# Patient Record
Sex: Male | Born: 2000 | Race: White | Hispanic: No | Marital: Single | Smoking: Former smoker
Health system: Southern US, Community
[De-identification: ages and names within clinical notes are randomized; demographics above are authoritative.]

## PROBLEM LIST (undated history)

## (undated) DIAGNOSIS — J45909 Unspecified asthma, uncomplicated: Secondary | ICD-10-CM

## (undated) HISTORY — PX: CIRCUMCISION: SUR203

## (undated) HISTORY — PX: WISDOM TOOTH EXTRACTION: SHX21

---
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
## 2019-04-10 ENCOUNTER — Ambulatory Visit
Admission: RE | Admit: 2019-04-10 | Discharge: 2019-04-10 | Disposition: A | Discharge status: Home / Self Care | Payer: Medicaid Other | Source: Ambulatory Visit | Attending: Family | Admitting: Family

## 2019-04-10 DIAGNOSIS — R112 Nausea with vomiting, unspecified: Secondary | ICD-10-CM

## 2020-05-21 ENCOUNTER — Other Ambulatory Visit: Payer: Self-pay

## 2020-05-21 ENCOUNTER — Encounter (HOSPITAL_BASED_OUTPATIENT_CLINIC_OR_DEPARTMENT_OTHER): Payer: Self-pay | Admitting: *Deleted

## 2020-05-21 ENCOUNTER — Emergency Department (HOSPITAL_BASED_OUTPATIENT_CLINIC_OR_DEPARTMENT_OTHER)
Admission: EM | Admit: 2020-05-21 | Discharge: 2020-05-21 | Disposition: A | Discharge status: Home / Self Care | Payer: Medicaid Other | Attending: Emergency Medicine | Admitting: Emergency Medicine

## 2020-05-21 DIAGNOSIS — S61212A Laceration without foreign body of right middle finger without damage to nail, initial encounter: Secondary | ICD-10-CM | POA: Insufficient documentation

## 2020-05-21 DIAGNOSIS — Z23 Encounter for immunization: Secondary | ICD-10-CM | POA: Diagnosis not present

## 2020-05-21 DIAGNOSIS — W268XXA Contact with other sharp object(s), not elsewhere classified, initial encounter: Secondary | ICD-10-CM | POA: Insufficient documentation

## 2020-05-21 DIAGNOSIS — F17200 Nicotine dependence, unspecified, uncomplicated: Secondary | ICD-10-CM | POA: Insufficient documentation

## 2020-05-21 DIAGNOSIS — S6991XA Unspecified injury of right wrist, hand and finger(s), initial encounter: Secondary | ICD-10-CM | POA: Diagnosis present

## 2020-05-21 DIAGNOSIS — Y92810 Car as the place of occurrence of the external cause: Secondary | ICD-10-CM | POA: Insufficient documentation

## 2020-05-21 DIAGNOSIS — S61219A Laceration without foreign body of unspecified finger without damage to nail, initial encounter: Secondary | ICD-10-CM

## 2020-05-21 MED ORDER — LIDOCAINE HCL (PF) 1 % IJ SOLN
30.0000 mL | Freq: Once | INTRAMUSCULAR | Status: AC
  Administered 2020-05-21: 30 mL
  Filled 2020-05-21 (×2): qty 30

## 2020-05-21 MED ORDER — TETANUS-DIPHTH-ACELL PERTUSSIS 5-2.5-18.5 LF-MCG/0.5 IM SUSY
0.5000 mL | PREFILLED_SYRINGE | Freq: Once | INTRAMUSCULAR | Status: AC
  Administered 2020-05-21: 0.5 mL via INTRAMUSCULAR
  Filled 2020-05-21: qty 0.5

## 2020-05-21 MED ORDER — CEPHALEXIN 500 MG PO CAPS: 500.0000 mg | ORAL_CAPSULE | Freq: Two times a day (BID) | ORAL | 0 refills | Status: AC

## 2020-05-21 NOTE — Discharge Instructions (Addendum)
Please follow closely with Dr. Merry Proud as soon as possible. Take the antibiotic as prescribed. Use tylenol, motrin and an ice pack to pain relief. KEEP THE AREA CLEAN AND DRY!  WOUND CARE   Keep area clean and dry for 24 hours. Do not remove bandage, if applied.  After 24 hours, remove bandage and wash wound gently with mild soap and warm water. Reapply a new bandage after cleaning wound, if directed.  Continue daily cleansing with soap and water until stitches/staples are removed.  Do not apply any ointments or creams to the wound while stitches/staples are in place, as this may cause delayed healing.  Seek medical careif you experience any of the following signs of infection: Swelling, redness, pus drainage, streaking, fever >101.0 F  Seek care if you experience excessive bleeding that does not stop after 15-20 minutes of constant, firm pressure.

## 2020-05-21 NOTE — ED Notes (Signed)
Patient reports continued tingling in middle 3 fingers of right hand distal to injury. Patient has reduced grip in same hand.

## 2020-05-21 NOTE — ED Provider Notes (Signed)
[facility] EMERGENCY DEPARTMENT Provider Note   CSN: [id number] Arrival date & time: 05/21/20  1911     History Chief Complaint  Patient presents with  . Laceration    Tony Dalton is a 20 y.o. male who presents to the ED with laceration of  The right hand. He was working on a car and had his hand in an engine and when he pulled it up and had a gash over the dorsal mcp jt of the middle finger.Marland Kitchen He is unsure of his last tetanus. He is R hand dominant.   HPI     History reviewed. No pertinent past medical history.  There are no problems to display for this patient.   Past Surgical History:  Procedure Laterality Date  . CIRCUMCISION         No family history on file.  Social History   Tobacco Use  . Smoking status: Current Every Day Smoker  . Smokeless tobacco: Never Used  Vaping Use  . Vaping Use: Every day  Substance Use Topics  . Alcohol use: No  . Drug use: No    Home Medications Prior to Admission medications   Medication Sig Start Date End Date Taking? Authorizing Provider  albuterol (PROVENTIL HFA;VENTOLIN HFA) 108 (90 BASE) MCG/ACT inhaler Inhale 1-2 puffs into the lungs every 6 (six) hours as needed for wheezing or shortness of breath.    [provider]  ondansetron (ZOFRAN ODT) 8 MG disintegrating tablet 8mg  ODT q8 hours prn nausea 11/29/18   Palumbo, April, MD    Allergies    Patient has no known allergies.  Review of Systems   Review of Systems Ten systems reviewed and are negative for acute change, except as noted in the HPI.   Physical Exam Updated Vital Signs BP 136/75 (BP Location: Right Arm)   Pulse 60   Temp 98.1 F (36.7 C) (Oral)   Resp 19   Ht 6\' 3"  (1.905 m)   Wt 74.8 kg   SpO2 100%   BMI 20.62 kg/m   Physical Exam Vitals and nursing note reviewed.  Constitutional:      General: He is not in acute distress.    Appearance: He is well-developed and well-nourished. He is not diaphoretic.  HENT:      Head: Normocephalic and atraumatic.  Eyes:     General: No scleral icterus.    Conjunctiva/sclera: Conjunctivae normal.  Cardiovascular:     Rate and Rhythm: Normal rate and regular rhythm.     Heart sounds: Normal heart sounds.  Pulmonary:     Effort: Pulmonary effort is normal. No respiratory distress.     Breath sounds: Normal breath sounds.  Abdominal:     Palpations: Abdomen is soft.     Tenderness: There is no abdominal tenderness.  Musculoskeletal:        General: No edema.     Cervical back: Normal range of motion and neck supple.     Comments: Laceration of the dorsum of the MCP joint of the right middle finger.  There appears to be extension into the.  The finger is hanging slightly lower than the others with weak extension.  He is able to extend at all joints but strength is diminished.  No numbness or tingling.  Skin:    General: Skin is warm and dry.  Neurological:     Mental Status: He is alert.  Psychiatric:        Behavior: Behavior normal.  ED Results / Procedures / Treatments   Labs (all labs ordered are listed, but only abnormal results are displayed) Labs Reviewed - No data to display  EKG None  Radiology No results found.  Procedures .Marland KitchenLaceration Repair  Date/Time: 05/21/2020 9:47 PM Performed by: Arthor Captain, PA-C Authorized by: Arthor Captain, PA-C   Consent:    Consent obtained:  Verbal   Consent given by:  Patient   Risks discussed:  Infection, need for additional repair, pain, poor cosmetic result and poor wound healing   Alternatives discussed:  No treatment and delayed treatment Universal protocol:    Procedure explained and questions answered to patient or proxy's satisfaction: yes     Relevant documents present and verified: yes     Test results available: yes     Imaging studies available: yes     Required blood products, implants, devices, and special equipment available: yes     Site/side marked: yes     Immediately prior  to procedure, a time out was called: yes     Patient identity confirmed:  Verbally with patient Anesthesia:    Anesthesia method:  Local infiltration Laceration details:    Location:  Hand   Hand location:  R hand, dorsum   Length (cm):  2 Pre-procedure details:    Preparation:  Patient was prepped and draped in usual sterile fashion Exploration:    Wound exploration: wound explored through full range of motion and entire depth of wound visualized     Wound extent: tendon damage     Tendon damage location:  Upper extremity   Upper extremity tendon damage location:  Finger extensor   Tendon damage extent:  Partial transection   Tendon repair plan:  Refer for evaluation Treatment:    Area cleansed with:  Povidone-iodine and Shur-Clens   Amount of cleaning:  Extensive   Irrigation solution:  Sterile saline   Irrigation method:  Pressure wash Skin repair:    Repair method:  Sutures   Suture size:  4-0   Suture material:  Prolene   Suture technique:  Simple interrupted   Number of sutures:  5 Approximation:    Approximation:  Close Repair type:    Repair type:  Simple Post-procedure details:    Dressing:  Splint for protection (SPLINT IN EXTENSION)     Medications Ordered in ED Medications - No data to display  ED Course  I have reviewed the triage vital signs and the nursing notes.  Pertinent labs & imaging results that were available during my care of the patient were reviewed by me and considered in my medical decision making (see chart for details).    MDM Rules/Calculators/A&P                          Patient with laceration to the dorsum of the right hand.  He has some weakness and likely tendon disruption.  I discussed the case with Dr. Arita Miss who is on-call for hand injury tonight.  He agrees with plan to close the wound superficially and have him follow-up in the office for likely surgical repair.  The wound was cleaned extensively and patient will be placed on  Keflex.  Tetanus updated.  Placed and splint with extension of the middle finger.  He appears otherwise appropriate for discharge at this time. Final Clinical Impression(s) / ED Diagnoses Final diagnoses:  None    Rx / DC Orders ED Discharge Orders    None  Arthor Captain, PA-C 05/21/20 2207    Pollyann Savoy, MD 05/21/20 [id number]

## 2020-05-21 NOTE — ED Triage Notes (Signed)
Laceration to his right hand. Bleeding controlled. He was working on a car removing a part.

## 2020-06-13 ENCOUNTER — Other Ambulatory Visit: Payer: Self-pay | Admitting: Orthopedic Surgery

## 2020-06-13 ENCOUNTER — Other Ambulatory Visit: Payer: Self-pay

## 2020-06-13 ENCOUNTER — Encounter (HOSPITAL_BASED_OUTPATIENT_CLINIC_OR_DEPARTMENT_OTHER): Payer: Self-pay | Admitting: Orthopedic Surgery

## 2020-06-14 ENCOUNTER — Other Ambulatory Visit (HOSPITAL_COMMUNITY): Payer: Medicaid Other

## 2020-06-14 NOTE — Progress Notes (Signed)

## 2020-06-15 ENCOUNTER — Other Ambulatory Visit (HOSPITAL_COMMUNITY)
Admission: RE | Admit: 2020-06-15 | Discharge: 2020-06-15 | Disposition: A | Discharge status: Home / Self Care | Payer: Medicaid Other | Source: Ambulatory Visit | Attending: Orthopedic Surgery | Admitting: Orthopedic Surgery

## 2020-06-15 DIAGNOSIS — X58XXXA Exposure to other specified factors, initial encounter: Secondary | ICD-10-CM | POA: Diagnosis not present

## 2020-06-15 DIAGNOSIS — S66322A Laceration of extensor muscle, fascia and tendon of right middle finger at wrist and hand level, initial encounter: Secondary | ICD-10-CM | POA: Diagnosis not present

## 2020-06-15 DIAGNOSIS — Z87891 Personal history of nicotine dependence: Secondary | ICD-10-CM | POA: Diagnosis not present

## 2020-06-15 DIAGNOSIS — Z20822 Contact with and (suspected) exposure to covid-19: Secondary | ICD-10-CM | POA: Diagnosis not present

## 2020-06-15 DIAGNOSIS — S61212A Laceration without foreign body of right middle finger without damage to nail, initial encounter: Secondary | ICD-10-CM | POA: Diagnosis present

## 2020-06-15 DIAGNOSIS — Y939 Activity, unspecified: Secondary | ICD-10-CM | POA: Diagnosis not present

## 2020-06-15 DIAGNOSIS — Z833 Family history of diabetes mellitus: Secondary | ICD-10-CM | POA: Diagnosis not present

## 2020-06-15 LAB — SARS CORONAVIRUS 2 (TAT 6-24 HRS): SARS Coronavirus 2: NEGATIVE

## 2020-06-18 ENCOUNTER — Ambulatory Visit (HOSPITAL_BASED_OUTPATIENT_CLINIC_OR_DEPARTMENT_OTHER): Payer: Medicaid Other | Admitting: Certified Registered"

## 2020-06-18 ENCOUNTER — Other Ambulatory Visit: Payer: Self-pay

## 2020-06-18 ENCOUNTER — Encounter (HOSPITAL_BASED_OUTPATIENT_CLINIC_OR_DEPARTMENT_OTHER)
Admission: RE | Disposition: A | Discharge status: Home / Self Care | Payer: Self-pay | Source: Home / Self Care | Attending: Orthopedic Surgery

## 2020-06-18 ENCOUNTER — Encounter (HOSPITAL_BASED_OUTPATIENT_CLINIC_OR_DEPARTMENT_OTHER): Payer: Self-pay | Admitting: Orthopedic Surgery

## 2020-06-18 ENCOUNTER — Ambulatory Visit (HOSPITAL_BASED_OUTPATIENT_CLINIC_OR_DEPARTMENT_OTHER)
Admission: RE | Admit: 2020-06-18 | Discharge: 2020-06-18 | Disposition: A | Discharge status: Home / Self Care | Payer: Medicaid Other | Attending: Orthopedic Surgery | Admitting: Orthopedic Surgery

## 2020-06-18 DIAGNOSIS — X58XXXA Exposure to other specified factors, initial encounter: Secondary | ICD-10-CM | POA: Insufficient documentation

## 2020-06-18 DIAGNOSIS — Z833 Family history of diabetes mellitus: Secondary | ICD-10-CM | POA: Diagnosis not present

## 2020-06-18 DIAGNOSIS — Z20822 Contact with and (suspected) exposure to covid-19: Secondary | ICD-10-CM | POA: Diagnosis not present

## 2020-06-18 DIAGNOSIS — Z87891 Personal history of nicotine dependence: Secondary | ICD-10-CM | POA: Diagnosis not present

## 2020-06-18 DIAGNOSIS — S66322A Laceration of extensor muscle, fascia and tendon of right middle finger at wrist and hand level, initial encounter: Secondary | ICD-10-CM | POA: Diagnosis not present

## 2020-06-18 DIAGNOSIS — Y939 Activity, unspecified: Secondary | ICD-10-CM | POA: Insufficient documentation

## 2020-06-18 HISTORY — DX: Unspecified asthma, uncomplicated: J45.909

## 2020-06-18 HISTORY — PX: TENDON REPAIR: SHX5111

## 2020-06-18 SURGERY — TENDON REPAIR
Anesthesia: Regional | Laterality: Right

## 2020-06-18 MED ORDER — TRAMADOL HCL 50 MG PO TABS: 50.0000 mg | ORAL_TABLET | Freq: Four times a day (QID) | ORAL | 0 refills | Status: AC | PRN

## 2020-06-18 MED ORDER — OXYCODONE HCL 5 MG PO TABS: 5.0000 mg | ORAL_TABLET | Freq: Once | ORAL | Status: DC | PRN

## 2020-06-18 MED ORDER — LACTATED RINGERS IV SOLN: INTRAVENOUS | Status: DC

## 2020-06-18 MED ORDER — PROMETHAZINE HCL 25 MG/ML IJ SOLN: 6.2500 mg | INTRAMUSCULAR | Status: DC | PRN

## 2020-06-18 MED ORDER — LIDOCAINE HCL (PF) 0.5 % IJ SOLN
INTRAMUSCULAR | Status: DC | PRN
  Administered 2020-06-18: 30 mL via INTRAVENOUS

## 2020-06-18 MED ORDER — FENTANYL CITRATE (PF) 100 MCG/2ML IJ SOLN
INTRAMUSCULAR | Status: AC
  Filled 2020-06-18: qty 2

## 2020-06-18 MED ORDER — CEFAZOLIN SODIUM-DEXTROSE 2-4 GM/100ML-% IV SOLN
2.0000 g | INTRAVENOUS | Status: AC
  Administered 2020-06-18: 2 g via INTRAVENOUS

## 2020-06-18 MED ORDER — LIDOCAINE HCL (PF) 1 % IJ SOLN
INTRAMUSCULAR | Status: DC | PRN
  Administered 2020-06-18: 5 mL

## 2020-06-18 MED ORDER — FENTANYL CITRATE (PF) 100 MCG/2ML IJ SOLN
25.0000 ug | INTRAMUSCULAR | Status: DC | PRN
  Administered 2020-06-18: 50 ug via INTRAVENOUS

## 2020-06-18 MED ORDER — MIDAZOLAM HCL 2 MG/2ML IJ SOLN
INTRAMUSCULAR | Status: AC
  Filled 2020-06-18: qty 2

## 2020-06-18 MED ORDER — OXYCODONE HCL 5 MG/5ML PO SOLN: 5.0000 mg | Freq: Once | ORAL | Status: DC | PRN

## 2020-06-18 MED ORDER — BUPIVACAINE HCL (PF) 0.25 % IJ SOLN
INTRAMUSCULAR | Status: DC | PRN
  Administered 2020-06-18: 5 mL

## 2020-06-18 MED ORDER — FENTANYL CITRATE (PF) 100 MCG/2ML IJ SOLN
INTRAMUSCULAR | Status: DC | PRN
  Administered 2020-06-18 (×2): 50 ug via INTRAVENOUS

## 2020-06-18 MED ORDER — DEXMEDETOMIDINE (PRECEDEX) IN NS 20 MCG/5ML (4 MCG/ML) IV SYRINGE
PREFILLED_SYRINGE | INTRAVENOUS | Status: DC | PRN
  Administered 2020-06-18: 4 ug via INTRAVENOUS

## 2020-06-18 MED ORDER — MIDAZOLAM HCL 5 MG/5ML IJ SOLN
INTRAMUSCULAR | Status: DC | PRN
  Administered 2020-06-18: 2 mg via INTRAVENOUS

## 2020-06-18 MED ORDER — CEFAZOLIN SODIUM-DEXTROSE 2-4 GM/100ML-% IV SOLN
INTRAVENOUS | Status: AC
  Filled 2020-06-18: qty 100

## 2020-06-18 MED ORDER — ONDANSETRON HCL 4 MG/2ML IJ SOLN
INTRAMUSCULAR | Status: DC | PRN
  Administered 2020-06-18: 4 mg via INTRAVENOUS

## 2020-06-18 MED ORDER — PROPOFOL 500 MG/50ML IV EMUL
INTRAVENOUS | Status: DC | PRN
  Administered 2020-06-18: 25 ug/kg/min via INTRAVENOUS

## 2020-06-18 SURGICAL SUPPLY — 63 items
APL PRP STRL LF DISP 70% ISPRP (MISCELLANEOUS) ×1
BLADE MINI RND TIP GREEN BEAV (BLADE) IMPLANT
BLADE SURG 15 STRL LF DISP TIS (BLADE) ×1 IMPLANT
BLADE SURG 15 STRL SS (BLADE) ×2
BNDG CMPR 9X4 STRL LF SNTH (GAUZE/BANDAGES/DRESSINGS) ×1
BNDG COHESIVE 3X5 TAN STRL LF (GAUZE/BANDAGES/DRESSINGS) ×2 IMPLANT
BNDG ESMARK 4X9 LF (GAUZE/BANDAGES/DRESSINGS) ×2 IMPLANT
BNDG GAUZE ELAST 4 BULKY (GAUZE/BANDAGES/DRESSINGS) ×2 IMPLANT
CHLORAPREP W/TINT 26 (MISCELLANEOUS) ×2 IMPLANT
CORD BIPOLAR FORCEPS 12FT (ELECTRODE) ×2 IMPLANT
COVER BACK TABLE 60X90IN (DRAPES) ×2 IMPLANT
COVER MAYO STAND STRL (DRAPES) ×2 IMPLANT
COVER WAND RF STERILE (DRAPES) IMPLANT
CUFF TOURN SGL QUICK 18X4 (TOURNIQUET CUFF) IMPLANT
DRAPE EXTREMITY T 121X128X90 (DISPOSABLE) ×2 IMPLANT
DRAPE OEC MINIVIEW 54X84 (DRAPES) IMPLANT
DRAPE SURG 17X23 STRL (DRAPES) ×2 IMPLANT
GAUZE SPONGE 4X4 12PLY STRL (GAUZE/BANDAGES/DRESSINGS) ×2 IMPLANT
GAUZE XEROFORM 1X8 LF (GAUZE/BANDAGES/DRESSINGS) ×2 IMPLANT
GLOVE SURG ORTHO LTX SZ8 (GLOVE) ×2 IMPLANT
GLOVE SURG UNDER POLY LF SZ8.5 (GLOVE) ×2 IMPLANT
GOWN STRL REUS W/ TWL LRG LVL3 (GOWN DISPOSABLE) ×1 IMPLANT
GOWN STRL REUS W/TWL LRG LVL3 (GOWN DISPOSABLE) ×2
GOWN STRL REUS W/TWL XL LVL3 (GOWN DISPOSABLE) ×2 IMPLANT
K-WIRE .035X4 (WIRE) IMPLANT
LOOP VESSEL MAXI BLUE (MISCELLANEOUS) IMPLANT
NEEDLE HYPO 22GX1.5 SAFETY (NEEDLE) ×2 IMPLANT
NEEDLE KEITH (NEEDLE) IMPLANT
NEEDLE PRECISIONGLIDE 27X1.5 (NEEDLE) IMPLANT
NS IRRIG 1000ML POUR BTL (IV SOLUTION) ×2 IMPLANT
PACK BASIN DAY SURGERY FS (CUSTOM PROCEDURE TRAY) ×2 IMPLANT
PAD CAST 3X4 CTTN HI CHSV (CAST SUPPLIES) ×1 IMPLANT
PADDING CAST ABS 3INX4YD NS (CAST SUPPLIES) ×1
PADDING CAST ABS 4INX4YD NS (CAST SUPPLIES) ×1
PADDING CAST ABS COTTON 3X4 (CAST SUPPLIES) ×1 IMPLANT
PADDING CAST ABS COTTON 4X4 ST (CAST SUPPLIES) ×1 IMPLANT
PADDING CAST COTTON 3X4 STRL (CAST SUPPLIES) ×2
SLEEVE SCD COMPRESS KNEE MED (STOCKING) ×2 IMPLANT
SPLINT PLASTER CAST XFAST 3X15 (CAST SUPPLIES) ×1 IMPLANT
SPLINT PLASTER XTRA FASTSET 3X (CAST SUPPLIES) ×1
STOCKINETTE 4X48 STRL (DRAPES) ×2 IMPLANT
SUT CHROMIC 5 0 P 3 (SUTURE) IMPLANT
SUT ETHIBOND 3-0 V-5 (SUTURE) IMPLANT
SUT ETHILON 4 0 PS 2 18 (SUTURE) ×2 IMPLANT
SUT ETHILON 5 0 PC 1 (SUTURE) ×2 IMPLANT
SUT FIBERWIRE 2-0 18 17.9 3/8 (SUTURE)
SUT FIBERWIRE 4-0 18 TAPR NDL (SUTURE) ×2
SUT MERSILENE 4 0 P 3 (SUTURE) IMPLANT
SUT MERSILENE 6-0 18IN S14 8MM (SUTURE) ×2
SUT PROLENE 2 0 SH DA (SUTURE) IMPLANT
SUT SILK 4 0 PS 2 (SUTURE) IMPLANT
SUT VIC AB 3-0 PS1 18 (SUTURE)
SUT VIC AB 3-0 PS1 18XBRD (SUTURE) IMPLANT
SUT VIC AB 4-0 P-3 18XBRD (SUTURE) IMPLANT
SUT VIC AB 4-0 P3 18 (SUTURE)
SUT VICRYL 4-0 PS2 18IN ABS (SUTURE) IMPLANT
SUTURE FIBERWR 2-0 18 17.9 3/8 (SUTURE) IMPLANT
SUTURE FIBERWR 4-0 18 TAPR NDL (SUTURE) ×1 IMPLANT
SUTURE MERSLN 6-0 18IN S14 8MM (SUTURE) ×1 IMPLANT
SYR BULB EAR ULCER 3OZ GRN STR (SYRINGE) ×2 IMPLANT
SYR CONTROL 10ML LL (SYRINGE) IMPLANT
TOWEL GREEN STERILE FF (TOWEL DISPOSABLE) ×4 IMPLANT
UNDERPAD 30X36 HEAVY ABSORB (UNDERPADS AND DIAPERS) ×2 IMPLANT

## 2020-06-18 NOTE — Anesthesia Postprocedure Evaluation (Signed)
Anesthesia Post Note  Patient: Tony Dalton  Procedure(s) Performed: REPAIR EXTENSOR TENDON RIGHT MIDDLE FINGER REPAIR (Right )     Patient location during evaluation: PACU Anesthesia Type: Bier Block Level of consciousness: awake and alert Pain management: pain level controlled Vital Signs Assessment: post-procedure vital signs reviewed and stable Respiratory status: spontaneous breathing, nonlabored ventilation and respiratory function stable Cardiovascular status: stable, blood pressure returned to baseline and bradycardic Anesthetic complications: no   No complications documented.  Last Vitals:  Vitals:   06/18/20 1415 06/18/20 1430  BP: (!) 147/78   Pulse: (!) 49 (!) 44  Resp: 16 14  Temp:    SpO2: 100% 99%    Last Pain:  Vitals:   06/18/20 1430  TempSrc:   PainSc: 4                  [name]

## 2020-06-18 NOTE — Op Note (Signed)
NAME: Tony Dalton MEDICAL RECORD NO: [id number] DATE OF BIRTH: 09-01-2000 FACILITY: [facility] LOCATION: [facility] PHYSICIAN: Nicki Reaper, MD   OPERATIVE REPORT   DATE OF PROCEDURE: 06/18/20    PREOPERATIVE DIAGNOSIS:   Laceration extensor tendon right middle   POSTOPERATIVE DIAGNOSIS:   Same   PROCEDURE:   Repair extensor tendon right middle   SURGEON: Cindee Salt, M.D.   ASSISTANT: none   ANESTHESIA:  Bier block with sedation   INTRAVENOUS FLUIDS:  Per anesthesia flow sheet.   ESTIMATED BLOOD LOSS:  Minimal.   COMPLICATIONS:  None.   SPECIMENS:  none   TOURNIQUET TIME:    Total Tourniquet Time Documented: Forearm (Right) - 38 minutes Total: Forearm (Right) - 38 minutes    DISPOSITION:  Stable to PACU.   INDICATIONS: Patient is a 20 year old male sustained a laceration over the metacarpal phalangeal joint of his right middle finger he has the inability to extend at this time.  The injury occurred approximately a month ago.  He was seen elsewhere and referred for continued treatment with the inability to extend his right middle finger.  We have discussed repair of the extensor tendon.  Preperi-and postoperative course been discussed along with risk complications.  He is aware there is no guarantee to the surgery the possibility of infection recurrence injury to arteries nerves tendons complete relief symptoms and dystrophy.  In the preoperative area the patient is seen extremity marked by both patient and surgeon antibiotic given  OPERATIVE COURSE: She is brought to the operating room where a forearm-based IV regional anesthetic was carried out without difficulty under the direction the anesthesia department after being placed in a supine position with the right arm free.  Prep was done with ChloraPrep a 3-minute dry time allowed and timeout taken to confirm patient procedure.  The old incision was used and carried proximally and distally in a zigzag manner  carried down through subcutaneous tissue the laceration of the extensor tendon was immediately apparent this was complete.  The area was debrided minimally with sharp dissection using a scalpel.  Approximately 1 mm of tissue was removed from the extensor tendon and surrounding tissue.  Wound was irrigated rrigated.  The extensor tendon was repaired using a 4-0 FiberWire suture using a Kessler technique.  A second Kessler suture was then placed through the FiberWire.  The upper T9 was then repaired with a running 6-0 Mersilene suture.  Wound was again irrigated.  The skin was then closed erupted 4 nylon sutures.  A sterile compressive dressing volar splint with wrist extended and the metacarpal phalangeal joint extended with the PIP DIP free.  Inflation of the tourniquet all fingers immediately pink.  He was taken to the recovery room for observation in satisfactory condition.  He will be discharged home to return to the hand center [facility] in 1 week and Tylenol ibuprofen for pain with Ultram for breakthrough.   Cindee Salt, MD Electronically signed, 06/18/20

## 2020-06-18 NOTE — Anesthesia Preprocedure Evaluation (Addendum)
Anesthesia Evaluation  Patient identified by MRN, date of birth, ID band Patient awake    Reviewed: Allergy & Precautions, NPO status , Patient's Chart, lab work & pertinent test results  History of Anesthesia Complications Negative for: history of anesthetic complications  Airway Mallampati: II  TM Distance: >3 FB Neck ROM: Full    Dental  (+) Dental Advisory Given, Teeth Intact   Pulmonary asthma (childhood) , Current Smoker (vape) and Patient abstained from smoking.,    Pulmonary exam normal        Cardiovascular negative cardio ROS Normal cardiovascular exam     Neuro/Psych negative neurological ROS  negative psych ROS   GI/Hepatic negative GI ROS, Neg liver ROS,   Endo/Other  negative endocrine ROS  Renal/GU negative Renal ROS     Musculoskeletal negative musculoskeletal ROS (+)   Abdominal   Peds  Hematology negative hematology ROS (+)   Anesthesia Other Findings Covid test negative   Reproductive/Obstetrics                            Anesthesia Physical Anesthesia Plan  ASA: II  Anesthesia Plan: Bier Block and Bier Block-LIDOCAINE ONLY   Post-op Pain Management:    Induction: Intravenous  PONV Risk Score and Plan: 1 and Propofol infusion and Treatment may vary due to age or medical condition  Airway Management Planned: Natural Airway and Simple Face Mask  Additional Equipment: None  Intra-op Plan:   Post-operative Plan:   Informed Consent: I have reviewed the patients History and Physical, chart, labs and discussed the procedure including the risks, benefits and alternatives for the proposed anesthesia with the patient or authorized representative who has indicated his/her understanding and acceptance.       Plan Discussed with: CRNA, Anesthesiologist and Surgeon  Anesthesia Plan Comments:        Anesthesia Quick Evaluation

## 2020-06-18 NOTE — Brief Op Note (Signed)
06/18/2020  2:01 PM  PATIENT:  Tony Dalton  20 y.o. male  PRE-OPERATIVE DIAGNOSIS:  laceration of extensor tendon right hand  POST-OPERATIVE DIAGNOSIS:  laceration of extensor tendon right hand  PROCEDURE:  Procedure(s): REPAIR EXTENSOR TENDON RIGHT MIDDLE FINGER REPAIR (Right)  SURGEON:  Surgeon(s) and Role:    Cindee Salt, MD - Primary  PHYSICIAN ASSISTANT:   ASSISTANTS: none   ANESTHESIA:   regional and IV sedation  EBL:  5 mL   BLOOD ADMINISTERED:none  DRAINS: none   LOCAL MEDICATIONS USED:  NONE  SPECIMEN:  No Specimen  DISPOSITION OF SPECIMEN:  N/A  COUNTS:correct  TOURNIQUET:   Total Tourniquet Time Documented: Forearm (Right) - 38 minutes Total: Forearm (Right) - 38 minutes   DICTATION: .Reubin Milan Dictation  PLAN OF CARE: Discharge to home after PACU  PATIENT DISPOSITION:  PACU - hemodynamically stable.

## 2020-06-18 NOTE — Transfer of Care (Signed)
Immediate Anesthesia Transfer of Care Note  Patient: Tony Dalton  Procedure(s) Performed: REPAIR EXTENSOR TENDON RIGHT MIDDLE FINGER REPAIR (Right )  Patient Location: PACU  Anesthesia Type:Bier block  Level of Consciousness: awake and alert   Airway & Oxygen Therapy: Patient Spontanous Breathing and Patient connected to face mask oxygen  Post-op Assessment: Report given to RN and Post -op Vital signs reviewed and stable  Post vital signs: Reviewed and stable  Last Vitals:  Vitals Value Taken Time  BP 151/78 06/18/20 1402  Temp    Pulse 48 06/18/20 1403  Resp 13 06/18/20 1403  SpO2 100 % 06/18/20 1403  Vitals shown include unvalidated device data.  Last Pain:  Vitals:   06/18/20 1245  TempSrc: Oral  PainSc: 0-No pain      Patients Stated Pain Goal: 4 (06/18/20 1245)  Complications: No complications documented.

## 2020-06-18 NOTE — Discharge Instructions (Addendum)

## 2020-06-18 NOTE — H&P (Signed)
Tony Dalton is an 20 y.o. male.   Chief Complaint: Tony Dalton HPI: Tony Dalton is a 20 year old right-hand-dominant male sustained a laceration over the dorsal aspect of his right [name] on March 8. He was seen at med center where this was repaired. He comes in today with the inability to extend his finger. Sutures have been removed. He is unable to straighten it. He has no prior history of injury. Is not planing of any pain or discomfort at the present time. Is no history of diabetes thyroid problems arthritis or gout. Family history is positive diabetes he has not been tested.     Past Medical History:  Diagnosis Date  . Asthma     Past Surgical History:  Procedure Laterality Date  . CIRCUMCISION    . WISDOM TOOTH EXTRACTION      History reviewed. No pertinent family history. Social History:  reports that he has quit smoking. He has never used smokeless tobacco. He reports that he does not drink alcohol and does not use drugs.  Allergies: No Known Allergies  No medications prior to admission.    No results found for this or any previous visit (from the past 48 hour(s)).  No results found.   Pertinent items are noted in HPI.  Height 6\' 3"  (1.905 m), weight 75.8 kg.  General appearance: alert, cooperative and appears stated age Head: Normocephalic, without obvious abnormality Neck: no JVD Resp: clear to auscultation bilaterally Cardio: regular rate and rhythm, S1, S2 normal, no murmur, click, rub or gallop GI: soft, non-tender; bowel sounds normal; no masses,  no organomegaly Extremities: Ability to extend his [name] right hand Pulses: 2+ and symmetric Skin: Skin color, texture, turgor normal. No rashes or lesions Neurologic: Grossly normal Incision/Wound: na  Assessment/Plan Assessment:   Laceration of extensor muscle, fascia and tendon of right [name] at wrist and hand level   Plan: He is seen with his mother we have  recommended surgical repair of the extensor tendon right [name] prepare postoperative course are discussed along with risk and complications. There is aware that there is no guarantee with the surgery possibility of infection recurrence injury to arteries nerves tendons amount of time for healing. Is scheduled as an outpatient under regional anesthesia for repair extensor tendon right [name].     06/18/2020, 11:40 AM

## 2020-06-19 ENCOUNTER — Encounter (HOSPITAL_BASED_OUTPATIENT_CLINIC_OR_DEPARTMENT_OTHER): Payer: Self-pay | Admitting: Orthopedic Surgery

## 2020-07-28 IMAGING — CT CT ABD-PELV W/ CM
2 of 4 series · 16 of 46 positions shown, 18 images · IV contrast (APPLIED)
Comparison: None.

CLINICAL DATA: 18-year-old male with nausea and vomiting since
yesterday.

EXAM:
CT ABDOMEN AND PELVIS WITH CONTRAST
TECHNIQUE: Multidetector CT imaging of the abdomen and pelvis was performed
using the standard protocol following bolus administration of
intravenous contrast.
CONTRAST:  100mL OMNIPAQUE IOHEXOL 300 MG/ML  SOLN

[Series 3: axial st · axial · 0.80mm/px · z∈[+138,+598]mm · 13 of 100 slices shown, 15 images]
[im 4/100  soft-tissue]
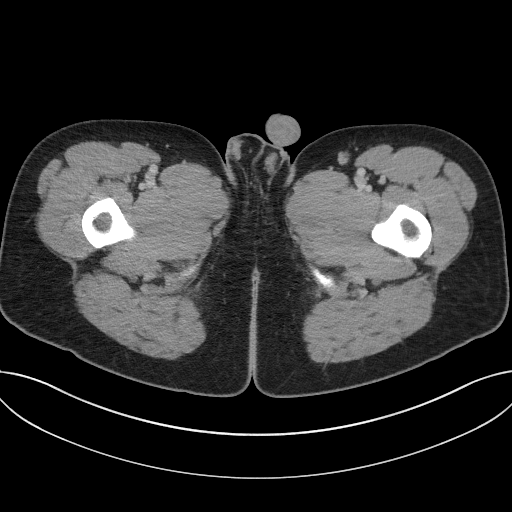
[im 4/100  bone]
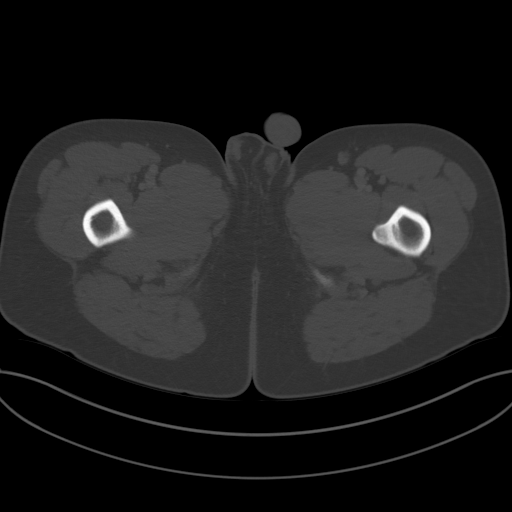
[im 12/100  soft-tissue]
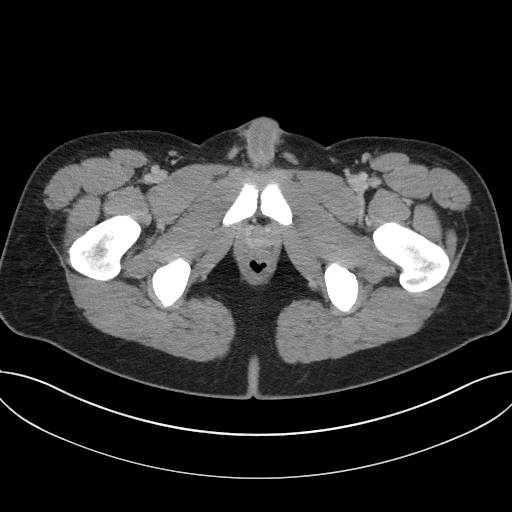
[im 20/100  soft-tissue]
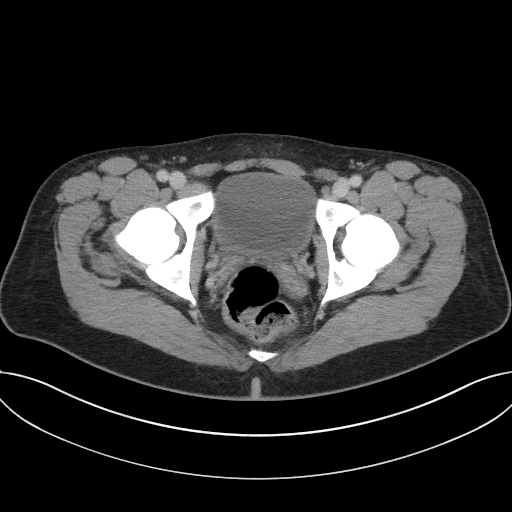
[im 28/100  soft-tissue]
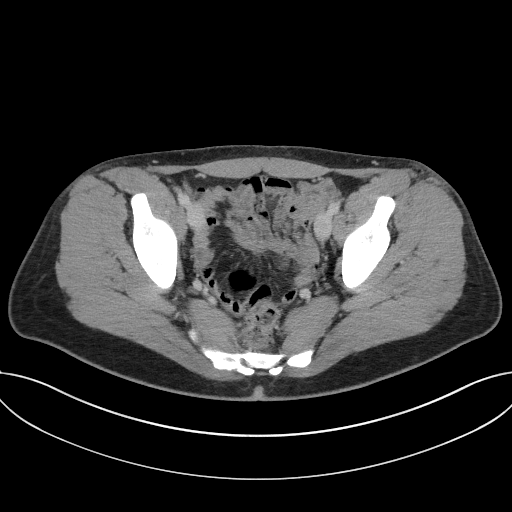
[im 36/100  soft-tissue]
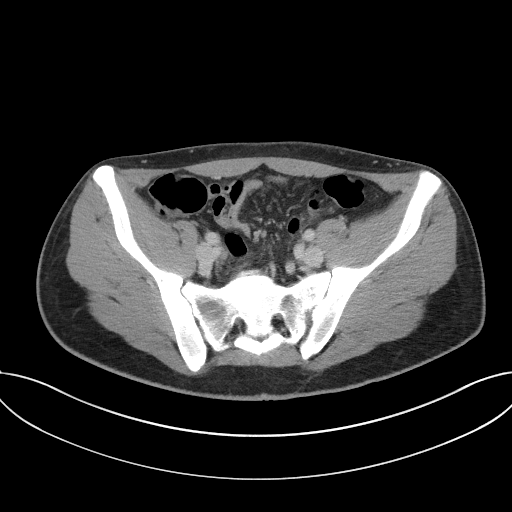
[im 44/100  soft-tissue]
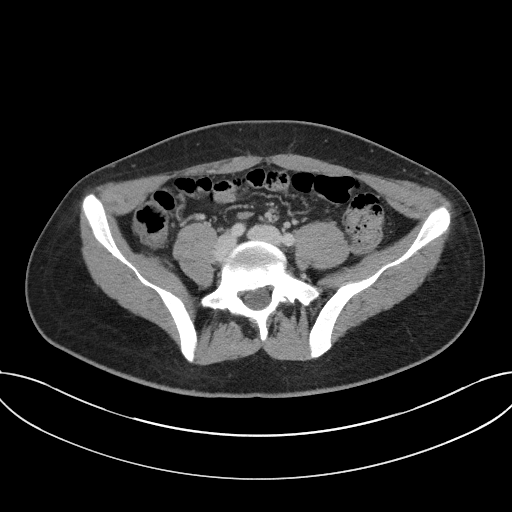
[im 52/100  soft-tissue]
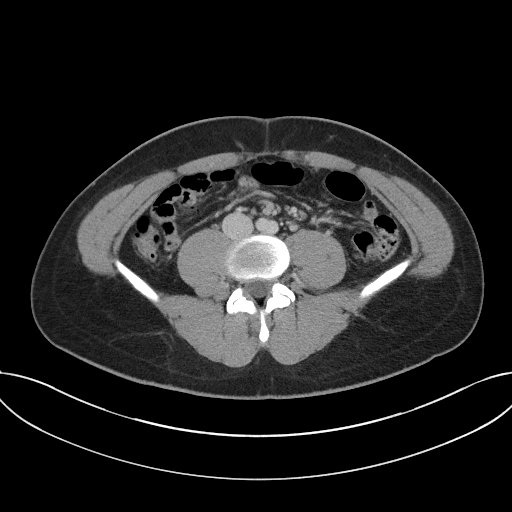
[im 56/100  soft-tissue]
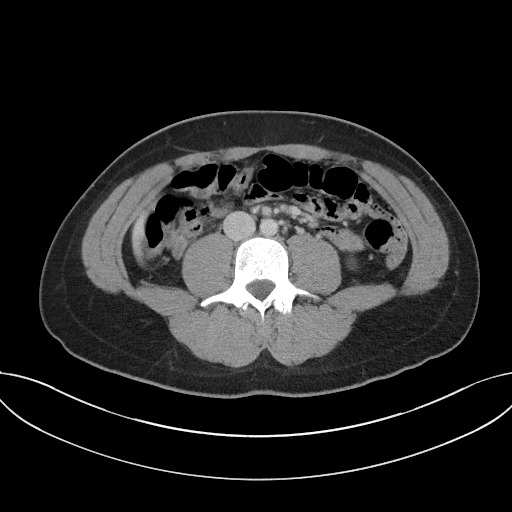
[im 64/100  soft-tissue]
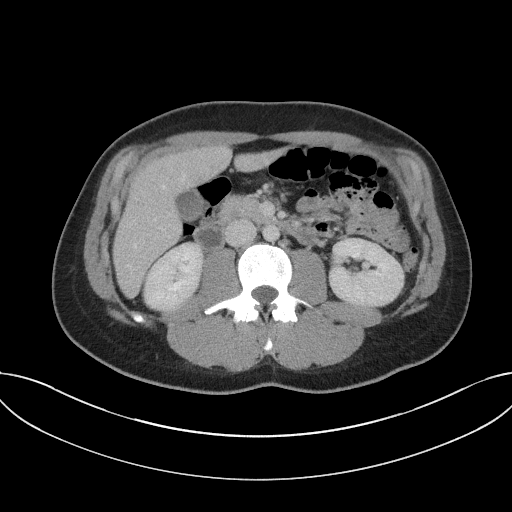
[im 64/100  bone]
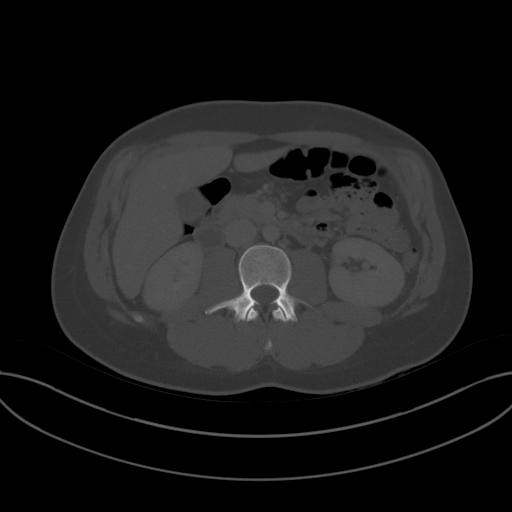
[im 72/100  soft-tissue]
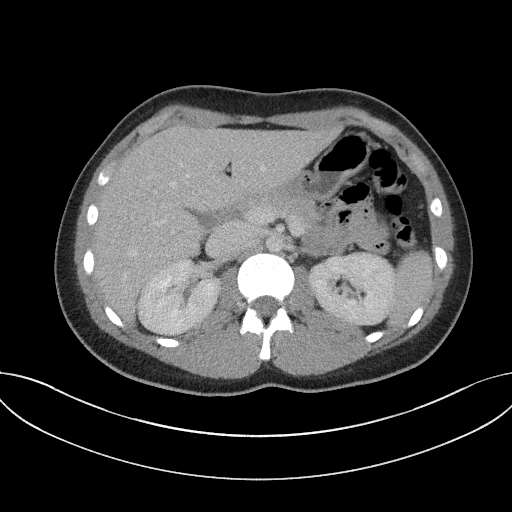
[im 80/100  soft-tissue]
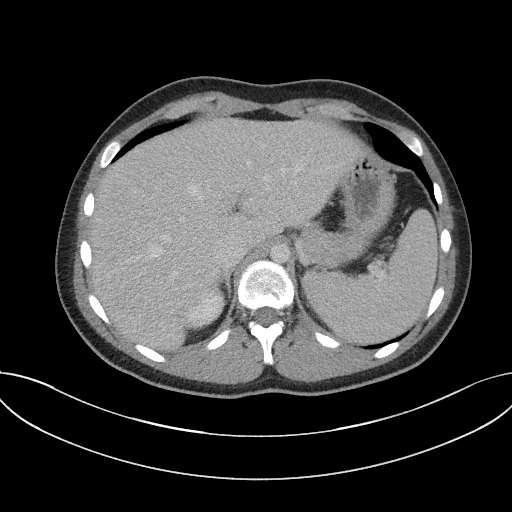
[im 88/100  soft-tissue]
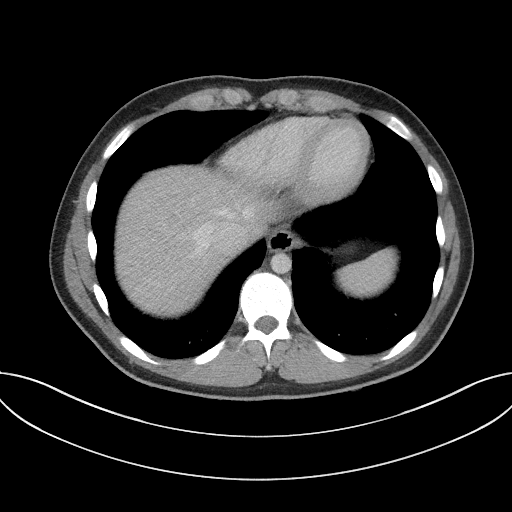
[im 96/100  soft-tissue]
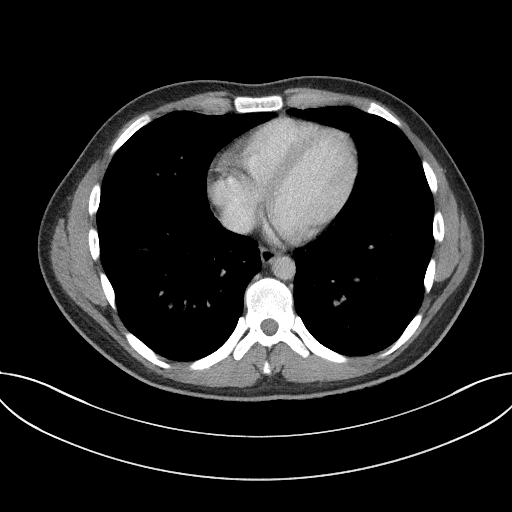

[Series 6: coronal st · coronal · 0.85mm/px · 3 of 73 slices shown]
[im 25/73  soft-tissue]
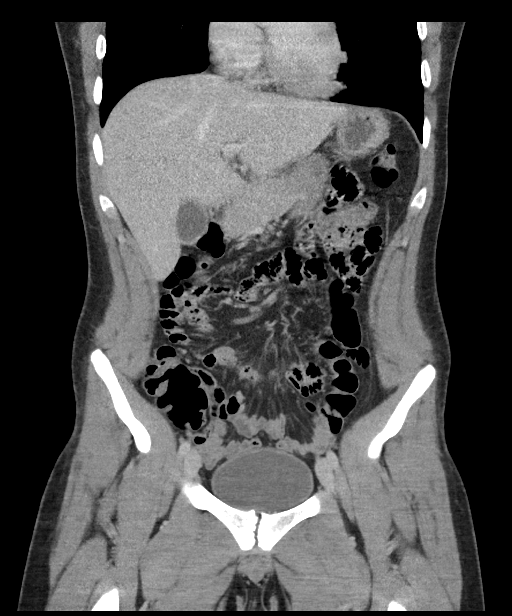
[im 33/73  soft-tissue]
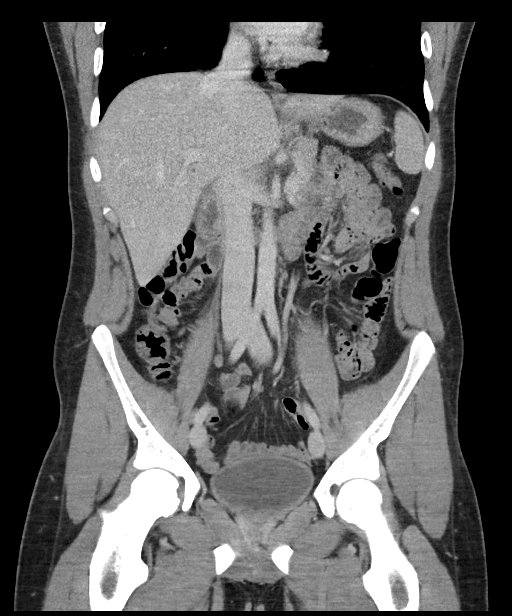
[im 41/73  soft-tissue]
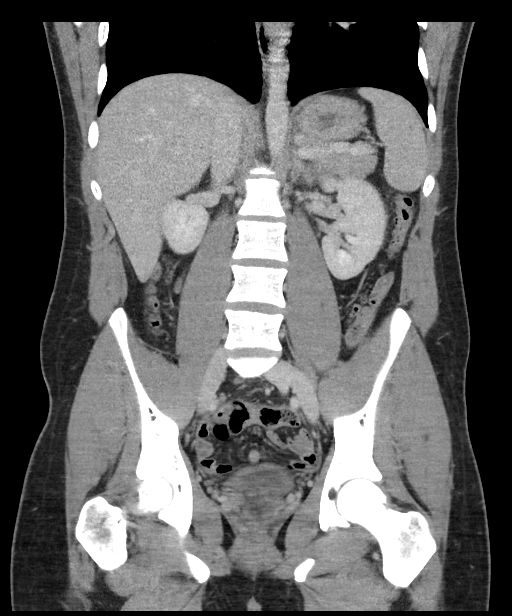

[16 of 46 positions shown; findings below may reference images not displayed]

FINDINGS: Lower chest: Mild dependent ground-glass opacity in the lower lobes
is likely atelectasis. No pericardial or pleural effusion.

Hepatobiliary: Negative liver and gallbladder.

Pancreas: Negative.

Spleen: Negative.

Adrenals/Urinary Tract: Normal adrenal glands. Symmetric renal
enhancement. No hydronephrosis or perinephric stranding. No
nephrolithiasis identified. Decompressed proximal ureters.
Unremarkable urinary bladder.

Stomach/Bowel: Negative rectum. Redundant but otherwise negative
sigmoid colon. The descending colon is redundant, otherwise
negative. Negative transverse colon. Negative right colon and
terminal ileum.

Normal appendix can be followed from the cecum toward the sacral
promontory and is visible on coronal image 43. No dilated small
bowel. No mesenteric stranding. Negative stomach and duodenum. No
free air, free fluid.

Vascular/Lymphatic: Major arterial structures and the central venous
structures in the abdomen and pelvis appear patent and normal.
Portal venous system appears patent. No lymphadenopathy.

Reproductive: Negative.

Other: No pelvic free fluid.

Musculoskeletal: No osseous abnormality identified. Nearing skeletal
maturity.
IMPRESSION: Negative CT Abdomen and Pelvis. Normal appendix, and no inflammatory
process identified.

## 2021-05-12 ENCOUNTER — Other Ambulatory Visit: Payer: Self-pay

## 2021-05-12 ENCOUNTER — Encounter: Payer: Self-pay | Admitting: Emergency Medicine

## 2021-05-12 ENCOUNTER — Ambulatory Visit
Admission: EM | Admit: 2021-05-12 | Discharge: 2021-05-12 | Disposition: A | Discharge status: Home / Self Care | Payer: Medicaid Other | Attending: Physician Assistant | Admitting: Physician Assistant

## 2021-05-12 ENCOUNTER — Ambulatory Visit: Admit: 2021-05-12 | Disposition: A | Discharge status: Home / Self Care | Payer: Self-pay

## 2021-05-12 DIAGNOSIS — Z202 Contact with and (suspected) exposure to infections with a predominantly sexual mode of transmission: Secondary | ICD-10-CM | POA: Insufficient documentation

## 2021-05-12 MED ORDER — DOXYCYCLINE HYCLATE 100 MG PO CAPS: 100.0000 mg | ORAL_CAPSULE | Freq: Two times a day (BID) | ORAL | 0 refills | Status: AC

## 2021-05-12 NOTE — Discharge Instructions (Signed)
Take antibiotic as prescribed Will call with test results  Abstain from sexual activity until treatment completed

## 2021-05-12 NOTE — ED Provider Notes (Signed)
[facility]    CSN: [id number] Arrival date & time: 05/12/21  1800      History   Chief Complaint Chief Complaint  Patient presents with   SEXUALLY TRANSMITTED DISEASE    HPI Tony Dalton is a 21 y.o. male.   Pt reports his girlfriend recently tested positive for chlamydia.  He reports at this time he is asymptomatic.  Requesting STD testing today.    Past Medical History:  Diagnosis Date   Asthma     There are no problems to display for this patient.   Past Surgical History:  Procedure Laterality Date   CIRCUMCISION     TENDON REPAIR Right 06/18/2020   Procedure: REPAIR EXTENSOR TENDON RIGHT MIDDLE FINGER REPAIR;  Surgeon: Daryll Brod, MD;  Location: [place];  Service: Orthopedics;  Laterality: Right;   WISDOM TOOTH EXTRACTION         Home Medications    Prior to Admission medications   Medication Sig Start Date End Date Taking? Authorizing Provider  doxycycline (VIBRAMYCIN) 100 MG capsule Take 1 capsule (100 mg total) by mouth 2 (two) times daily for 7 days. 05/12/21 05/19/21 Yes Ward, Lenise Arena, PA-C  traMADol (ULTRAM) 50 MG tablet Take 1 tablet (50 mg total) by mouth every 6 (six) hours as needed. 06/18/20   Daryll Brod, MD    Family History History reviewed. No pertinent family history.  Social History Social History   Tobacco Use   Smoking status: Former   Smokeless tobacco: Never  Scientific laboratory technician Use: Every day   Substances: Nicotine  Substance Use Topics   Alcohol use: No   Drug use: No     Allergies   Patient has no known allergies.   Review of Systems Review of Systems  Constitutional:  Negative for chills and fever.  HENT:  Negative for ear pain and sore throat.   Eyes:  Negative for pain and visual disturbance.  Respiratory:  Negative for cough and shortness of breath.   Cardiovascular:  Negative for chest pain and palpitations.  Gastrointestinal:  Negative for abdominal pain and vomiting.   Genitourinary:  Negative for dysuria and hematuria.  Musculoskeletal:  Negative for arthralgias and back pain.  Skin:  Negative for color change and rash.  Neurological:  Negative for seizures and syncope.  All other systems reviewed and are negative.   Physical Exam Triage Vital Signs ED Triage Vitals [05/12/21 1850]  Enc Vitals Group     BP (!) 145/75     Pulse Rate 61     Resp 16     Temp 98.2 F (36.8 C)     Temp Source Oral     SpO2 99 %     Weight      Height      Head Circumference      Peak Flow      Pain Score 0     Pain Loc      Pain Edu?      Excl. in [place]?    No data found.  Updated Vital Signs BP (!) 145/75 (BP Location: Right Arm)    Pulse 61    Temp 98.2 F (36.8 C) (Oral)    Resp 16    SpO2 99%   Visual Acuity Right Eye Distance:   Left Eye Distance:   Bilateral Distance:    Right Eye Near:   Left Eye Near:    Bilateral Near:     Physical  Exam Vitals and nursing note reviewed.  Constitutional:      General: He is not in acute distress.    Appearance: He is well-developed.  HENT:     Head: Normocephalic and atraumatic.  Eyes:     Conjunctiva/sclera: Conjunctivae normal.  Cardiovascular:     Rate and Rhythm: Normal rate and regular rhythm.     Heart sounds: No murmur heard. Pulmonary:     Effort: Pulmonary effort is normal. No respiratory distress.     Breath sounds: Normal breath sounds.  Abdominal:     Palpations: Abdomen is soft.     Tenderness: There is no abdominal tenderness.  Musculoskeletal:        General: No swelling.     Cervical back: Neck supple.  Skin:    General: Skin is warm and dry.     Capillary Refill: Capillary refill takes less than 2 seconds.  Neurological:     Mental Status: He is alert.  Psychiatric:        Mood and Affect: Mood normal.     UC Treatments / Results  Labs (all labs ordered are listed, but only abnormal results are displayed) Labs Reviewed  CYTOLOGY, (ORAL, ANAL, URETHRAL) ANCILLARY ONLY     EKG   Radiology No results found.  Procedures Procedures (including critical care time)  Medications Ordered in UC Medications - No data to display  Initial Impression / Assessment and Plan / UC Course  I have reviewed the triage vital signs and the nursing notes.  Pertinent labs & imaging results that were available during my care of the patient were reviewed by me and considered in my medical decision making (see chart for details).     Will treat for chlamydia due to positive exposure.  Will change treatment plan based on STD testing.  Return precautions discussed.  Final Clinical Impressions(s) / UC Diagnoses   Final diagnoses:  STD exposure     Discharge Instructions      Take antibiotic as prescribed Will call with test results  Abstain from sexual activity until treatment completed   ED Prescriptions     Medication Sig Dispense Auth. Provider   doxycycline (VIBRAMYCIN) 100 MG capsule Take 1 capsule (100 mg total) by mouth 2 (two) times daily for 7 days. 14 capsule Ward, Lenise Arena, PA-C      PDMP not reviewed this encounter.   Ward, Lenise Arena, PA-C 05/12/21 1912

## 2021-05-12 NOTE — ED Triage Notes (Signed)
Girlfriend tested positive for chlamydia last week. Here to be tested. No symptoms

## 2021-05-15 LAB — CYTOLOGY, (ORAL, ANAL, URETHRAL) ANCILLARY ONLY
Chlamydia: NEGATIVE
Comment: NEGATIVE
Comment: NEGATIVE
Comment: NORMAL
Neisseria Gonorrhea: NEGATIVE
Trichomonas: NEGATIVE

## 2021-11-10 ENCOUNTER — Encounter (HOSPITAL_BASED_OUTPATIENT_CLINIC_OR_DEPARTMENT_OTHER): Payer: Self-pay | Admitting: Emergency Medicine

## 2021-11-10 ENCOUNTER — Emergency Department (HOSPITAL_BASED_OUTPATIENT_CLINIC_OR_DEPARTMENT_OTHER)
Admission: EM | Admit: 2021-11-10 | Discharge: 2021-11-10 | Disposition: A | Discharge status: Home / Self Care | Payer: Medicaid Other | Attending: Emergency Medicine | Admitting: Emergency Medicine

## 2021-11-10 ENCOUNTER — Other Ambulatory Visit: Payer: Self-pay

## 2021-11-10 ENCOUNTER — Emergency Department (HOSPITAL_BASED_OUTPATIENT_CLINIC_OR_DEPARTMENT_OTHER): Payer: Medicaid Other

## 2021-11-10 DIAGNOSIS — X500XXA Overexertion from strenuous movement or load, initial encounter: Secondary | ICD-10-CM | POA: Diagnosis not present

## 2021-11-10 DIAGNOSIS — M25561 Pain in right knee: Secondary | ICD-10-CM | POA: Diagnosis present

## 2021-11-10 DIAGNOSIS — M7041 Prepatellar bursitis, right knee: Secondary | ICD-10-CM

## 2021-11-10 DIAGNOSIS — Y9389 Activity, other specified: Secondary | ICD-10-CM | POA: Insufficient documentation

## 2021-11-10 DIAGNOSIS — Y990 Civilian activity done for income or pay: Secondary | ICD-10-CM | POA: Insufficient documentation

## 2021-11-10 DIAGNOSIS — L03115 Cellulitis of right lower limb: Secondary | ICD-10-CM

## 2021-11-10 MED ORDER — CEFADROXIL 500 MG PO CAPS: 500.0000 mg | ORAL_CAPSULE | Freq: Two times a day (BID) | ORAL | 0 refills | Status: AC

## 2021-11-10 MED ORDER — SULFAMETHOXAZOLE-TRIMETHOPRIM 800-160 MG PO TABS: 1.0000 | ORAL_TABLET | Freq: Two times a day (BID) | ORAL | 0 refills | Status: AC

## 2021-11-10 NOTE — ED Notes (Signed)
Ace wrap to rt knee pt states understanding of all d/c instructions including finishing all antiobiotics

## 2021-11-10 NOTE — ED Provider Notes (Signed)
[facility] EMERGENCY DEPARTMENT Provider Note   CSN: [id number] Arrival date & time: 11/10/21  1018     History Chief Complaint  Patient presents with   Knee Pain    Tony Dalton is a 21 y.o. male otherwise healthy presents to the ED for evaluation of right knee pain for the past 4 days. The patient reports he has been trying a salve and ibuprofen and it is helping, but if he does not take the medications, he is having pain. He works as a Education administrator in and bending, lifting, and on his knees a lot. He denies any specific trauma. Denies any fever or any IVDU. Denies any numbness or tingling. Still ambulatory.    Knee Pain Associated symptoms: no fever        Home Medications Prior to Admission medications   Medication Sig Start Date End Date Taking? Authorizing Provider  traMADol (ULTRAM) 50 MG tablet Take 1 tablet (50 mg total) by mouth every 6 (six) hours as needed. 06/18/20   Cindee Salt, MD      Allergies    Patient has no known allergies.    Review of Systems   Review of Systems  Constitutional:  Negative for chills and fever.  Musculoskeletal:  Positive for arthralgias and joint swelling.  Neurological:  Negative for weakness and numbness.    Physical Exam Updated Vital Signs BP 124/72 (BP Location: Left Arm)   Pulse 66   Temp 98.1 F (36.7 C) (Oral)   Resp 18   Ht 6\' 3"  (1.905 m)   Wt 74.8 kg   SpO2 100%   BMI 20.62 kg/m  Physical Exam Vitals and nursing note reviewed.  Constitutional:      General: He is not in acute distress.    Appearance: He is not ill-appearing or toxic-appearing.  Musculoskeletal:        General: Swelling and tenderness present. Normal range of motion.     Comments: RLE -overlying erythema to the knee joint.  Overlying warmth with some crepitus upon range of motion.  Mild to moderate amount of swelling.  No red streaking noted.  Compartments are soft.  Palpable pulses. Sensation intact bilaterally. Patient has full range of  motion with some pain.  Weightbearing.  Ambulatory.  Neurological:     Mental Status: He is alert.     ED Results / Procedures / Treatments   Labs (all labs ordered are listed, but only abnormal results are displayed) Labs Reviewed - No data to display  EKG None  Radiology DG Knee Complete 4 Views Right  Result Date: 11/10/2021 CLINICAL DATA:  Anterior knee pain, swelling since Friday. No known injury. EXAM: RIGHT KNEE - COMPLETE 4+ VIEW COMPARISON:  None Available. FINDINGS: No evidence of fracture, dislocation, or joint effusion. No evidence of arthropathy or other focal bone abnormality. Anterior soft tissue swelling. IMPRESSION: No acute osseous abnormality or joint effusion of the right knee. Electronically Signed   By: Thursday M.D.   On: 11/10/2021 10:50    Procedures Procedures   Medications Ordered in ED Medications - No data to display  ED Course/ Medical Decision Making/ A&P                           Medical Decision Making Amount and/or Complexity of Data Reviewed Radiology: ordered.   21 year old male presents to the emergency room for evaluation of right knee pain and swelling.  Differential diagnosis includes was  not bit to sprain, strain, cellulitis, septic arthritis, dislocation, fracture, gout.  Vital signs are unremarkable.  Afebrile, normal pulse rate, satting well on room air without increased work of breathing.  Normotensive.  Physical exam shows overlying erythema to the knee joint.  Overlying warmth with some crepitus upon range of motion.  Mild to moderate amount of swelling.  No red streaking noted.  Compartments are soft.  Palpable pulses. Sensation intact bilaterally. Patient has full range of motion with some pain.  Weightbearing.  Ambulatory.   X-ray of the right knee shows no acute osseous abnormality or joint effusion of the right knee.  No evidence of any fracture, dislocation, or joint effusion.  No evidence of any arthropathy or other  focal bone abnormality.  My attending assessed the patient at bedside does not think this is any septic arthritis given the patient has full range of motion of his knee and is weightbearing and ambulatory.  He likely thinks this is some prepatellar bursitis given the patient's on his knees frequently with work and possibly has some overlying cellulitis.  We will place the patient on Duricef and Bactrim twice daily for the next 10 days.  Will give strict return precautions on returning for septic arthritis. He does not think a joint tap needs to be done at this time.   I discussed the imaging findings with the patient.  Discussed the need for Duricef and Bactrim to take twice daily for the next 10 days.  We discussed return precautions and red flag symptoms.  Patient verbalized understanding and agrees to the plan.  Patient is stable and being discharged home in good condition.  I discussed this case with my attending physician who cosigned this note including patient's presenting symptoms, physical exam, and planned diagnostics and interventions. Attending physician stated agreement with plan or made changes to plan which were implemented.   Attending physician assessed patient at bedside.  Final Clinical Impression(s) / ED Diagnoses Final diagnoses:  Prepatellar bursitis of right knee  Cellulitis of right lower extremity    Rx / DC Orders ED Discharge Orders          Ordered    sulfamethoxazole-trimethoprim (BACTRIM DS) 800-160 MG tablet  2 times daily        11/10/21 1225    cefadroxil (DURICEF) 500 MG capsule  2 times daily        11/10/21 1225              Achille Rich, [place] 11/10/21 1231    Ernie Avena, MD 11/10/21 [phone]

## 2021-11-10 NOTE — ED Triage Notes (Signed)
Rt knee swelling and painful since friday

## 2021-11-10 NOTE — Discharge Instructions (Addendum)
You were seen in the emergency department for evaluation of your right knee pain.  We think this is likely prepatellar bursitis which is an inflammation of her knee with possibly some overlying infection of the skin.  For this, we are placing you on 2 medications for you to take daily for the next 10 days.  If you do not have any improvement of symptoms in the next 48 hours I would like for you to return to the ER.  If any worsening symptoms, fever, inability move the joint, spreading redness, I would like you to return the emergency department immediately for reevaluation.  You can continue taking ibuprofen 600 mg every 6 hours as needed.  You do not continue using the overlying salve.  It is vitally important that you take these antibiotics as prescribed and complete them to the end of the course.  Again, if you have any pain on moving the joint, fevers, worsening redness, worsening pain, worsening swelling, please come the emergency department immediately.  If you have any other concerns, new or worsening symptoms, please return to the emergency department.  I have included information for Dr. Clare Gandy, a sports medicine physician, for you to follow-up with as needed.  Contact a health care provider if: You have pain that is not controlled with medicine. You develop a fever or chills. You have redness, warmth, pain, or swelling that returns after treatment. Get help right away if: You have signs of worsening infection in your joint. Watch for: Very severe pain. Redness. Warmth. Swelling. You have rapid breathing or you have trouble breathing. You have chest pain. You cannot drink fluids or make urine. You notice that the affected area changed color or turned blue. You have numbness or severe pain in the affected area. These symptoms may be an emergency. Get help right away. Call 911. Do not wait to see if the symptoms will go away. Do not drive yourself to the hospital.

## 2022-10-17 ENCOUNTER — Ambulatory Visit (HOSPITAL_COMMUNITY)
Admission: EM | Admit: 2022-10-17 | Discharge: 2022-10-17 | Disposition: A | Discharge status: Home / Self Care | Payer: Medicaid Other | Attending: Internal Medicine | Admitting: Internal Medicine

## 2022-10-17 ENCOUNTER — Encounter (HOSPITAL_COMMUNITY): Payer: Self-pay

## 2022-10-17 DIAGNOSIS — L03115 Cellulitis of right lower limb: Secondary | ICD-10-CM

## 2022-10-17 DIAGNOSIS — L02415 Cutaneous abscess of right lower limb: Secondary | ICD-10-CM

## 2022-10-17 MED ORDER — DOXYCYCLINE HYCLATE 100 MG PO CAPS: 100.0000 mg | ORAL_CAPSULE | Freq: Two times a day (BID) | ORAL | 0 refills | Status: AC

## 2022-10-17 NOTE — Discharge Instructions (Signed)
Your abscess has been evaluated in the clinic and appears to be infected.   I would like for you to perform warm compresses to the area frequently and continue taking over the counter medications for pain and inflammation as directed.   Start taking antibiotic sent to pharmacy as directed. This will help reduce infection to area and help the abscess mature/drain.   If you have any fever, chills, nausea, vomiting, or worsening pain/swelling to the area, please return to urgent care for reevaluation.

## 2022-10-17 NOTE — ED Triage Notes (Signed)
Patient here today with c/o infection on the anterior right lower leg X 1 day. Patient states that he had to climb up and down ladders on Tuesday at work and his legs hit the step on the ladder.

## 2022-10-17 NOTE — ED Provider Notes (Signed)
[facility]    CSN: [id number] Arrival date & time: 10/17/22  1057      History   Chief Complaint Chief Complaint  Patient presents with   Abscess    HPI Tony Dalton is a 22 y.o. male.   Patient presents to urgent care for evaluation of redness, swelling, and abscess to the right lower leg that he first noticed on Tuesday, October 13, 2022 (5 days ago).  Patient was at work as a Education administrator on a ladder and he noticed that his right lower extremity was rubbing up against one of the rungs of the ladder causing wound.  Wound was very small at first 5 days ago but has grown significantly in size and tenderness/warmth.  Wound started to drain purulent fluid 2 days ago and redness has spread.  Denies numbness and tingling distally to the wound and extremity weakness.  No history of immunosuppression or diabetes.  Last tetanus injection was last year.  He has not attempted use of any over-the-counter medications to help with symptoms.  Denies recent antibiotic or steroid use in the last 90 days.  No recent fevers or chills.     Past Medical History:  Diagnosis Date   Asthma     There are no problems to display for this patient.   Past Surgical History:  Procedure Laterality Date   CIRCUMCISION     TENDON REPAIR Right 06/18/2020   Procedure: REPAIR EXTENSOR TENDON RIGHT MIDDLE FINGER REPAIR;  Surgeon: Cindee Salt, MD;  Location: [facility];  Service: Orthopedics;  Laterality: Right;   WISDOM TOOTH EXTRACTION         Home Medications    Prior to Admission medications   Medication Sig Start Date End Date Taking? Authorizing Provider  doxycycline (VIBRAMYCIN) 100 MG capsule Take 1 capsule (100 mg total) by mouth 2 (two) times daily for 7 days. 10/17/22 10/24/22 Yes StanhopeDonavan Burnet, FNP  cefadroxil (DURICEF) 500 MG capsule Take 1 capsule (500 mg total) by mouth 2 (two) times daily. 11/10/21   Achille Rich, PA-C  traMADol (ULTRAM) 50 MG tablet Take 1  tablet (50 mg total) by mouth every 6 (six) hours as needed. 06/18/20   Cindee Salt, MD    Family History History reviewed. No pertinent family history.  Social History Social History   Tobacco Use   Smoking status: Former   Smokeless tobacco: Never  Advertising account planner   Vaping status: Every Day   Substances: Nicotine  Substance Use Topics   Alcohol use: Yes   Drug use: No     Allergies   Patient has no known allergies.   Review of Systems Review of Systems Per HPI  Physical Exam Triage Vital Signs ED Triage Vitals  Encounter Vitals Group     BP 10/17/22 1218 122/64     Systolic BP Percentile --      Diastolic BP Percentile --      Pulse Rate 10/17/22 1218 (!) 45     Resp 10/17/22 1218 15     Temp 10/17/22 1218 98 F (36.7 C)     Temp Source 10/17/22 1218 Oral     SpO2 10/17/22 1218 99 %     Weight 10/17/22 1217 149 lb (67.6 kg)     Height 10/17/22 1217 6\' 3"  (1.905 m)     Head Circumference --      Peak Flow --      Pain Score 10/17/22 1213 3  Pain Loc --      Pain Education --      Exclude from Growth Chart --    No data found.  Updated Vital Signs BP 122/64 (BP Location: Left Arm)   Pulse (!) 45   Temp 98 F (36.7 C) (Oral)   Resp 15   Ht 6\' 3"  (1.905 m)   Wt 149 lb (67.6 kg)   SpO2 99%   BMI 18.62 kg/m   Visual Acuity Right Eye Distance:   Left Eye Distance:   Bilateral Distance:    Right Eye Near:   Left Eye Near:    Bilateral Near:     Physical Exam Vitals and nursing note reviewed.  Constitutional:      Appearance: He is not ill-appearing or toxic-appearing.  HENT:     Head: Normocephalic and atraumatic.     Right Ear: Hearing and external ear normal.     Left Ear: Hearing and external ear normal.     Nose: Nose normal.     Mouth/Throat:     Lips: Pink.  Eyes:     General: Lids are normal. Vision grossly intact. Gaze aligned appropriately.     Extraocular Movements: Extraocular movements intact.     Conjunctiva/sclera:  Conjunctivae normal.  Pulmonary:     Effort: Pulmonary effort is normal.  Musculoskeletal:     Cervical back: Neck supple.  Skin:    General: Skin is warm and dry.     Capillary Refill: Capillary refill takes less than 2 seconds.     Findings: Lesion present. No rash.          Comments: Warmth, erythema, swelling, and purulent drainage to abscess.  No underlying induration to palpation.  See image below.  Neurological:     General: No focal deficit present.     Mental Status: He is alert and oriented to person, place, and time. Mental status is at baseline.     Cranial Nerves: No dysarthria or facial asymmetry.  Psychiatric:        Mood and Affect: Mood normal.        Speech: Speech normal.        Behavior: Behavior normal.        Thought Content: Thought content normal.        Judgment: Judgment normal.      UC Treatments / Results  Labs (all labs ordered are listed, but only abnormal results are displayed) Labs Reviewed - No data to display  EKG   Radiology No results found.  Procedures Procedures (including critical care time)  Medications Ordered in UC Medications - No data to display  Initial Impression / Assessment and Plan / UC Course  I have reviewed the triage vital signs and the nursing notes.  Pertinent labs & imaging results that were available during my care of the patient were reviewed by me and considered in my medical decision making (see chart for details).   1.  Cellulitis and abscess of right leg Doxycycline twice daily for 7 days to treat infected abscess and cellulitis.  Warm compresses recommended to continue to encourage drainage. May use ibuprofen or Tylenol as needed for pain. If symptoms fail to improve in the next 24 to 48 hours with use of antibiotic, advised to return.  No systemic signs or symptoms of infection currently.  Tdap is already up-to-date.  Counseled patient on potential for adverse effects with medications  prescribed/recommended today, strict ER and return-to-clinic precautions discussed, patient verbalized understanding.  Final Clinical Impressions(s) / UC Diagnoses   Final diagnoses:  Cellulitis and abscess of right leg     Discharge Instructions      Your abscess has been evaluated in the clinic and appears to be infected.   I would like for you to perform warm compresses to the area frequently and continue taking over the counter medications for pain and inflammation as directed.   Start taking antibiotic sent to pharmacy as directed. This will help reduce infection to area and help the abscess mature/drain.   If you have any fever, chills, nausea, vomiting, or worsening pain/swelling to the area, please return to urgent care for reevaluation.    ED Prescriptions     Medication Sig Dispense Auth. Provider   doxycycline (VIBRAMYCIN) 100 MG capsule Take 1 capsule (100 mg total) by mouth 2 (two) times daily for 7 days. 14 capsule Carlisle Beers, FNP      PDMP not reviewed this encounter.   Carlisle Beers, [place] 10/17/22 1317
